# Patient Record
Sex: Female | Born: 1960 | Race: Black or African American | Hispanic: No | Marital: Married | Smoking: Never smoker
Health system: Southern US, Community
[De-identification: ages and names within clinical notes are randomized; demographics above are authoritative.]

## PROBLEM LIST (undated history)

## (undated) DIAGNOSIS — M549 Dorsalgia, unspecified: Secondary | ICD-10-CM

---

## 2017-03-15 ENCOUNTER — Emergency Department (HOSPITAL_COMMUNITY)

## 2017-03-15 ENCOUNTER — Encounter (HOSPITAL_COMMUNITY): Payer: Self-pay | Admitting: Emergency Medicine

## 2017-03-15 ENCOUNTER — Emergency Department (HOSPITAL_COMMUNITY)
Admission: EM | Admit: 2017-03-15 | Discharge: 2017-03-15 | Disposition: A | Discharge status: Home / Self Care | Attending: Emergency Medicine | Admitting: Emergency Medicine

## 2017-03-15 DIAGNOSIS — M5416 Radiculopathy, lumbar region: Secondary | ICD-10-CM | POA: Insufficient documentation

## 2017-03-15 DIAGNOSIS — E119 Type 2 diabetes mellitus without complications: Secondary | ICD-10-CM | POA: Insufficient documentation

## 2017-03-15 DIAGNOSIS — M549 Dorsalgia, unspecified: Secondary | ICD-10-CM

## 2017-03-15 DIAGNOSIS — I10 Essential (primary) hypertension: Secondary | ICD-10-CM | POA: Diagnosis not present

## 2017-03-15 DIAGNOSIS — M545 Low back pain: Secondary | ICD-10-CM | POA: Diagnosis present

## 2017-03-15 HISTORY — DX: Dorsalgia, unspecified: M54.9

## 2017-03-15 MED ORDER — LORAZEPAM 1 MG PO TABS
1.0000 mg | ORAL_TABLET | Freq: Once | ORAL | Status: AC
  Administered 2017-03-15: 1 mg via ORAL
  Filled 2017-03-15: qty 1

## 2017-03-15 MED ORDER — ACETAMINOPHEN 325 MG PO TABS
650.0000 mg | ORAL_TABLET | Freq: Four times a day (QID) | ORAL | 0 refills | Status: AC | PRN
Start: 2017-03-15 — End: ?

## 2017-03-15 MED ORDER — IBUPROFEN 400 MG PO TABS
600.0000 mg | ORAL_TABLET | Freq: Once | ORAL | Status: AC
  Administered 2017-03-15: 600 mg via ORAL
  Filled 2017-03-15: qty 1

## 2017-03-15 MED ORDER — METHOCARBAMOL 500 MG PO TABS
500.0000 mg | ORAL_TABLET | Freq: Once | ORAL | Status: AC
  Administered 2017-03-15: 500 mg via ORAL
  Filled 2017-03-15: qty 1

## 2017-03-15 MED ORDER — TRAMADOL HCL 50 MG PO TABS: 50.0000 mg | ORAL_TABLET | Freq: Four times a day (QID) | ORAL | 0 refills | Status: AC | PRN

## 2017-03-15 MED ORDER — METHOCARBAMOL 500 MG PO TABS: 500.0000 mg | ORAL_TABLET | Freq: Two times a day (BID) | ORAL | 0 refills | Status: AC

## 2017-03-15 NOTE — ED Triage Notes (Signed)
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

## 2017-03-15 NOTE — ED Notes (Signed)
Patient was called multiple times in the waiting room by multiple staff members without response.

## 2017-03-15 NOTE — ED Notes (Signed)
Declined W/C at D/C and was escorted to lobby by RN. 

## 2017-03-15 NOTE — ED Triage Notes (Signed)
Pt reports she is here for a MRI. Pt reports she was last in ED in [place] and while in the ED in [place] she was told she needed a MRI for the back pain that radiates into Lt leg. Pt denies any injury but has had previous back surgery. Pt reports her pain is a 12/10 Pt reports the pain scale highest is 10/10.

## 2017-03-15 NOTE — ED Notes (Signed)
Patient returned from MRI.

## 2017-03-15 NOTE — ED Triage Notes (Signed)
Pt. Stated, I've had a leg pain since June 15 and its getting worse. I had a rupture disc before. And its going to my leg.

## 2017-03-15 NOTE — ED Provider Notes (Signed)
[facility]-EMERGENCY DEPT Provider Note   CSN: [id number] Arrival date & time: 03/15/17  1239  By signing my name below, I, Jodi Riggs, attest that this documentation has been prepared under the direction and in the presence of Jodi IndustriesJeff Kena Limon, PA-C. Electronically Signed: Linna Darnerussell Riggs, Scribe. 03/15/2017. 3:19 PM.  History   Chief Complaint Chief Complaint  Patient presents with  . Leg Pain  . Back Pain   The history is provided by the patient. No language interpreter was used.    HPI Comments: Jodi Riggs is a 56 y.o. female with a PMHx of back pain, HTN, and DM and a PSHx of discectomy who presents to the Emergency Department complaining of progressively worsening lower back pain radiating down the left lower extremity since 02/21/17. She rates her pain at a 10/10 in severity. No recent trauma to her back. She reports some associated intermittent numbness in her left lower leg and foot as well as some occasional weakness of her left lower extremity. Patient has had difficulty ambulating secondary to her back pain. 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 She has also been taking Aleve at home without relief. Patient had an ultrasound of the LLE within the last few weeks which was negative for DVT. She ruptured a disc in her right lower back six years ago and states her current pain feels similar but on the opposite side. She denies fevers, chills, or any other associated symptoms. Patient [place] originally from [place], [place] but [place] She is staying with her daughter in [name] until the end of summer.  Past Medical History:  Diagnosis Date  . Back pain     There are no active problems to display for this patient.   History reviewed. No pertinent surgical history.  OB  History    No data available       Home Medications    Prior to Admission medications   Medication Sig Start Date End Date Taking? Authorizing Provider  acetaminophen (TYLENOL) 325 MG tablet Take 2 tablets (650 mg total) by mouth every 6 (six) hours as needed. 03/15/17   Jodi Riggs, Tinnie GensJeffrey, PA-C  methocarbamol (ROBAXIN) 500 MG tablet Take 1 tablet (500 mg total) by mouth 2 (two) times daily. 03/15/17   Jodi Riggs, Tinnie GensJeffrey, PA-C  traMADol (ULTRAM) 50 MG tablet Take 1 tablet (50 mg total) by mouth every 6 (six) hours as needed. 03/15/17   Jodi Riggs, Jodi Wolaver, PA-C    Family History No family history on file.  Social History Social History  Substance Use Topics  . Smoking status: Never Smoker  . Smokeless tobacco: Never Used  . Alcohol use Yes     Allergies   Levaquin [levofloxacin]   Review of Systems Review of Systems  Constitutional: Negative for chills and fever.  Musculoskeletal: Positive for back pain, gait problem and myalgias.  Neurological: Positive for weakness and numbness.  All other systems reviewed and are negative.  Physical Exam Updated Vital Signs BP (!) 141/61 (BP Location: Right Arm)   Pulse 78   Temp 98.4 F (36.9 C) (Oral)   Resp 18   Ht 5\' 6"  (1.676 m)   Wt 97.5 kg (215 lb)   SpO2 100%   BMI 34.70 kg/m   Physical Exam  Constitutional: She is oriented to person, place, and time. She appears well-developed and  well-nourished. No distress.  HENT:  Head: Normocephalic and atraumatic.  Eyes: Conjunctivae and EOM are normal.  Neck: Neck supple. No tracheal deviation present.  Cardiovascular: Normal rate.   Pulmonary/Chest: Effort normal. No respiratory distress.  Musculoskeletal: Normal range of motion.  Neurological: She is alert and oriented to person, place, and time.  Skin: Skin is warm and dry.  Psychiatric: She has a normal mood and affect. Her behavior is normal.  Nursing note and vitals reviewed.  ED Treatments / Results  Labs (all labs ordered  are listed, but only abnormal results are displayed) Labs Reviewed - No data to display  EKG  EKG Interpretation None       Radiology No results found.  Procedures Procedures (including critical care time)  DIAGNOSTIC STUDIES: Oxygen Saturation is 98% on RA, normal by my interpretation.    COORDINATION OF CARE: 3:19 PM Discussed treatment plan with pt at bedside and pt agreed to plan.  Medications Ordered in ED Medications  LORazepam (ATIVAN) tablet 1 mg (1 mg Oral Given 03/15/17 1539)  methocarbamol (ROBAXIN) tablet 500 mg (500 mg Oral Given 03/15/17 1539)  ibuprofen (ADVIL,MOTRIN) tablet 600 mg (600 mg Oral Given 03/15/17 1539)     Initial Impression / Assessment and Plan / ED Course  I have reviewed the triage vital signs and the nursing notes.  Pertinent labs & imaging results that were available during my care of the patient were reviewed by me and considered in my medical decision making (see chart for details).      Final Clinical Impressions(s) / ED Diagnoses   Final diagnoses:  Back pain  Lumbar radiculopathy   Patient presents with radiculopathy.  History of the same requesting MRI.  MRI shows disc protrusion likely causing her symptoms.  Patient is visiting from out of town, she will follow up with neurosurgery as directed, return precautions given.  Patient highly encouraged to follow-up with neurosurgery as soon as possible, she verbalized understanding and agreement to today's plan.   New Prescriptions Discharge Medication List as of 03/15/2017  5:46 PM    START taking these medications   Details  acetaminophen (TYLENOL) 325 MG tablet Take 2 tablets (650 mg total) by mouth every 6 (six) hours as needed., Starting Thu 03/15/2017, Print    methocarbamol (ROBAXIN) 500 MG tablet Take 1 tablet (500 mg total) by mouth 2 (two) times daily., Starting Thu 03/15/2017, Print    traMADol (ULTRAM) 50 MG tablet Take 1 tablet (50 mg total) by mouth every 6 (six) hours as  needed., Starting Thu 03/15/2017, Print       I personally performed the services described in this documentation, which was scribed in my presence. The recorded information has been reviewed and is accurate.    Jodi Mechanic, PA-C 03/28/17 1455    Jerelyn Scott, MD 03/30/17 [phone]

## 2017-03-15 NOTE — Discharge Instructions (Signed)
Please read attached information. If you experience any new or worsening signs or symptoms please return to the emergency room for evaluation. Please follow-up with your primary care provider or specialist as discussed. Please use medication prescribed only as directed and discontinue taking if you have any concerning signs or symptoms.   °

## 2017-03-23 ENCOUNTER — Telehealth: Payer: Self-pay | Admitting: Surgery

## 2017-03-23 NOTE — Telephone Encounter (Signed)
ED CM [facility] medications

## 2018-07-08 IMAGING — MR MR LUMBAR SPINE W/O CM
4 of 5 series · 18 of 48 positions shown · non-contrast
Comparison: None.

CLINICAL DATA: Progressively worsening low back pain radiating down
the left lower extremity since 02/21/2017. Left lower extremity
numbness and weakness intermittently. Prior discectomy.

EXAM:
MRI LUMBAR SPINE WITHOUT CONTRAST
TECHNIQUE: Multiplanar, multisequence MR imaging of the lumbar spine was
performed. No intravenous contrast was administered.

[Series 3: T2 · sagittal · 4.0mm · 0.55mm/px · 6 of 15 slices shown (1 of 2)]
[im 1/15]
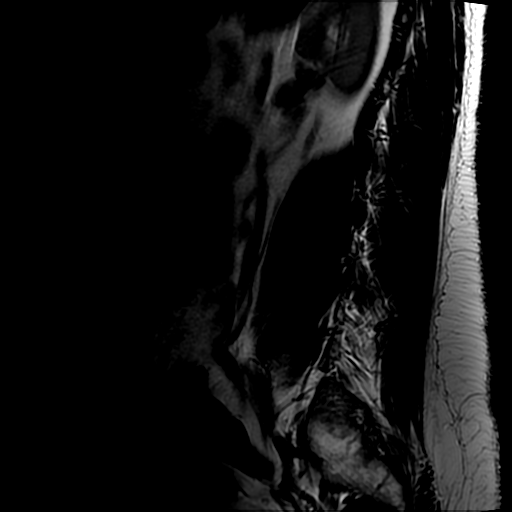
[im 3/15]
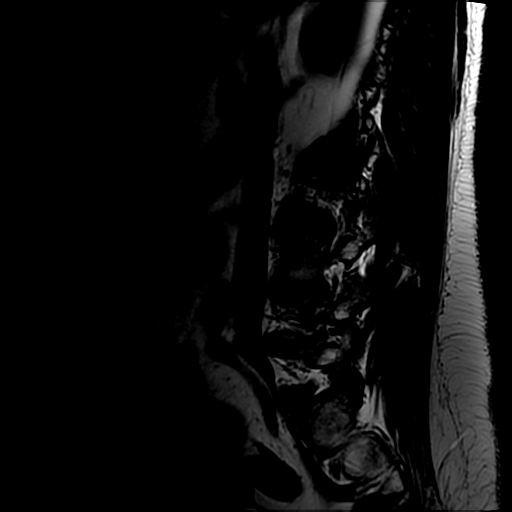
[im 6/15]
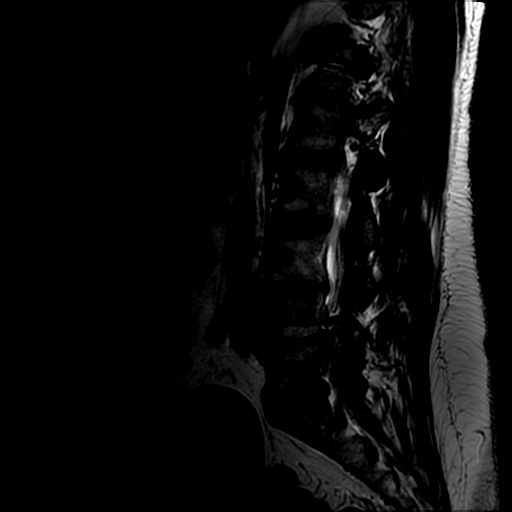
[im 9/15]
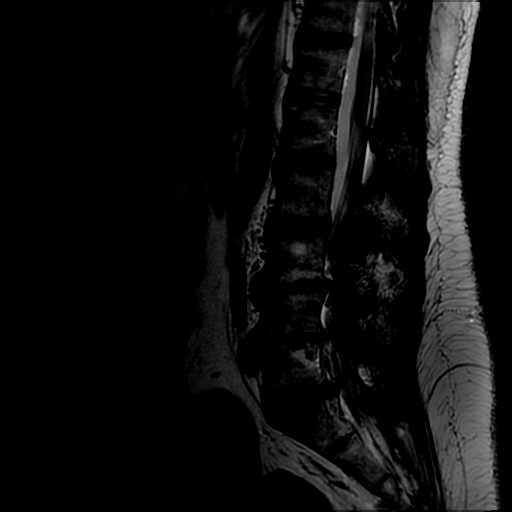
[im 12/15]
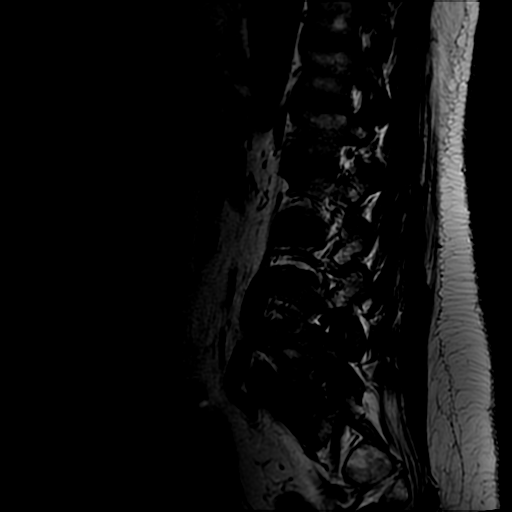
[im 15/15]
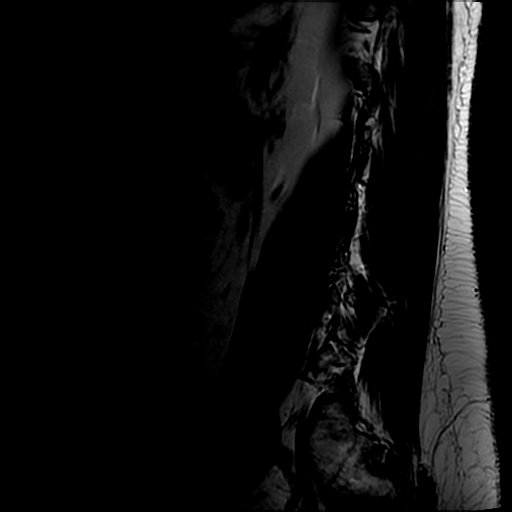

[Series 5: T1 · sagittal · 4.0mm · 0.55mm/px · 3 of 15 slices shown (1 of 2)]
[im 3/15]
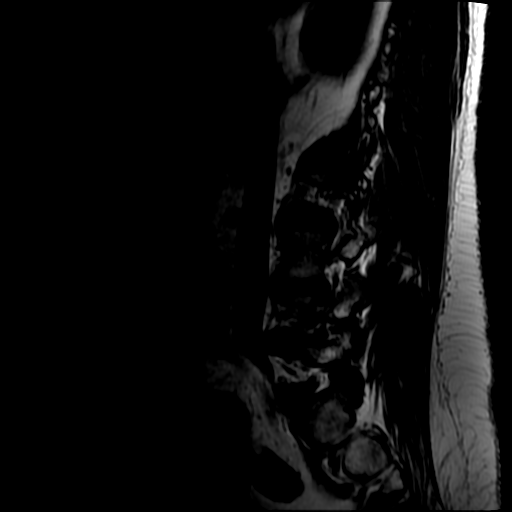
[im 9/15]
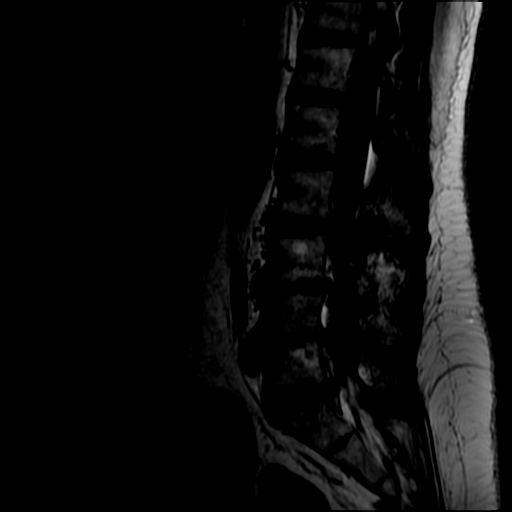
[im 15/15]
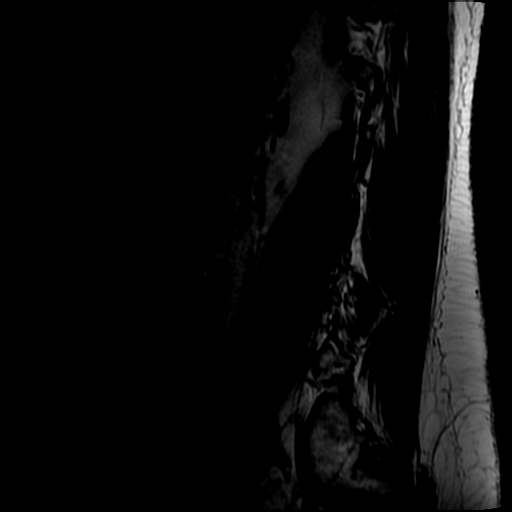

[Series 6: T2 · axial · 4.0mm · 0.39mm/px · z∈[-34,+131]mm · 6 of 40 slices shown (2 of 2)]
[im 1/40]
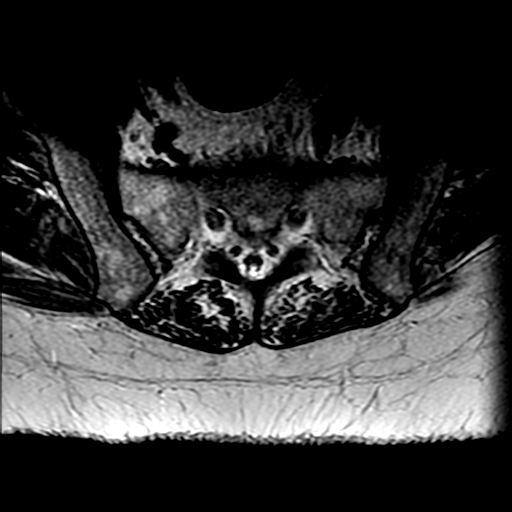
[im 6/40]
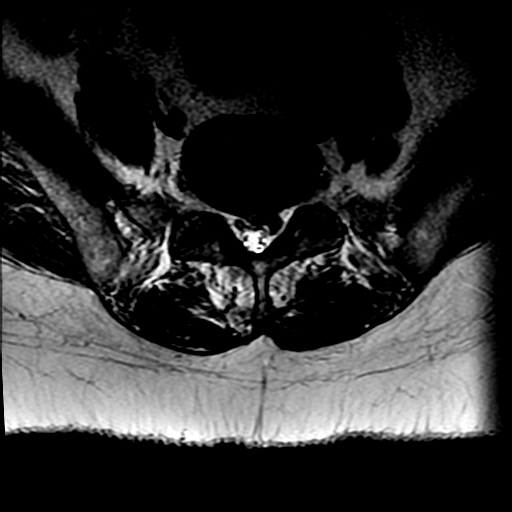
[im 12/40]
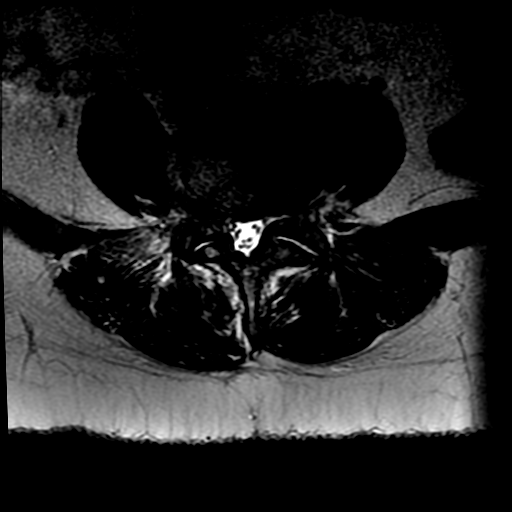
[im 17/40]
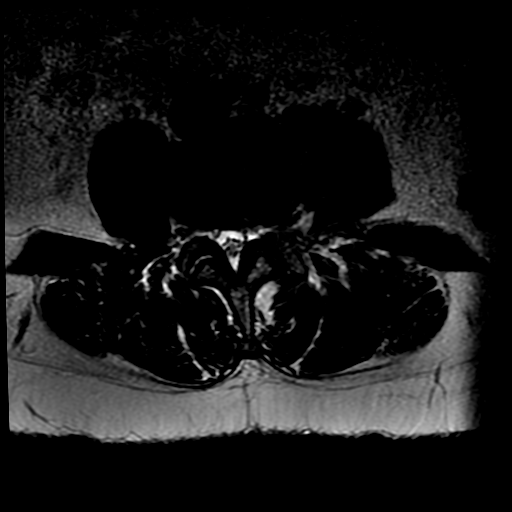
[im 20/40]
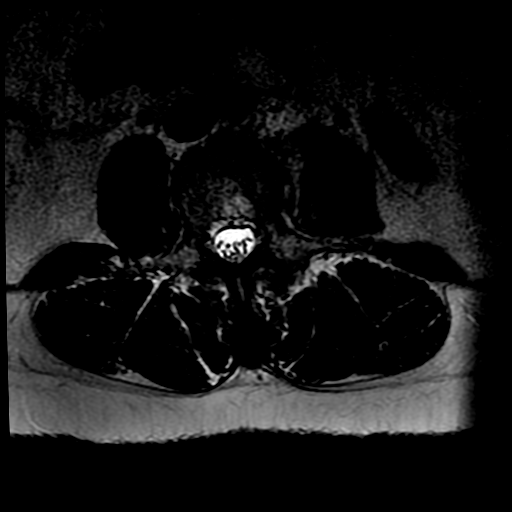
[im 34/40]
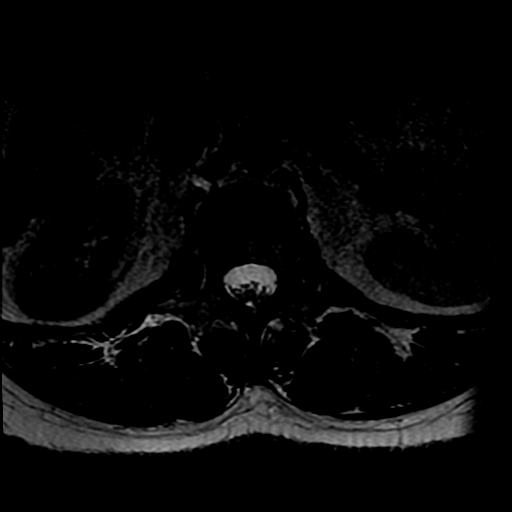

[Series 7: T1 · axial · 4.0mm · 0.39mm/px · z∈[-9,+131]mm · 3 of 40 slices shown (2 of 2)]
[im 6/40]
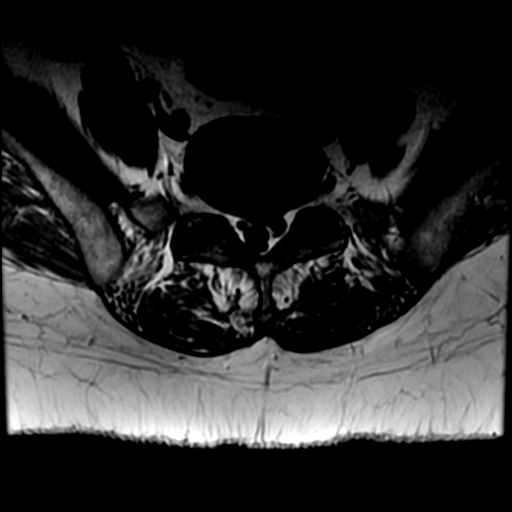
[im 20/40]
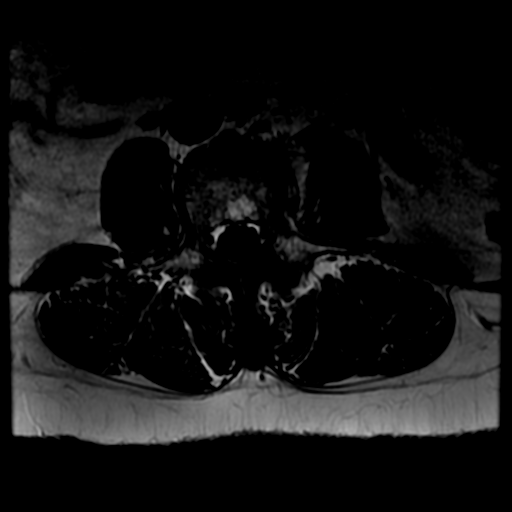
[im 34/40]
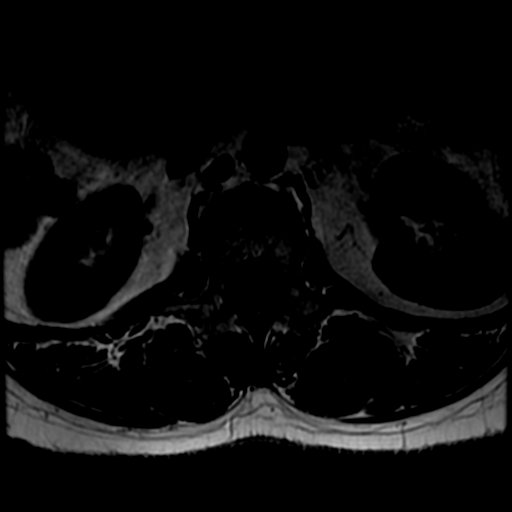

[18 of 48 positions shown; findings below may reference images not displayed]

FINDINGS: The study is mildly motion degraded.

Segmentation: Normal lumbar segmentation is assumed, with the lowest
fully formed disc space designated L5-S1.

Alignment: Straightening of the normal lumbar lordosis. Mild lumbar
dextroscoliosis. No listhesis.

Vertebrae: No fracture or suspicious osseous lesion. A few small
hemangiomas are noted. Mild type 1 and type 2 degenerative endplate
changes are present at L3-4 and L4-5 with moderate to severe disc
space narrowing at both levels.

Conus medullaris: Extends to the lower L1 level and appears normal.

Paraspinal and other soft tissues: Partially visualized enlarged,
heterogeneous uterus containing multiple masses, likely fibroids.

Disc levels:

T12-L1 and L1-2:  Negative.

L2-3: Shallow right foraminal to extraforaminal disc protrusion
without stenosis.

L3-4: Advanced disc degeneration. Small left subarticular disc
protrusion, asymmetric leftward disc bulging and endplate spurring,
and moderate facet and ligamentum flavum hypertrophy result in
moderate left lateral recess stenosis and moderate left neural
foraminal stenosis potentially affecting the left L3 and L4 nerve
roots.

L4-5: Advanced disc degeneration. Prior right laminotomy.
Circumferential disc bulging and moderate facet arthrosis result in
mild left lateral recess stenosis and severe right and mild left
neural foraminal stenosis without spinal stenosis.

L5-S1: Small to moderate-sized left paracentral disc protrusion
results in left lateral recess stenosis and displaces the traversing
left S1 nerve root. Disc bulging and mild facet arthrosis result in
mild right neural foraminal stenosis.
IMPRESSION: 1. L5-S1 disc protrusion likely affecting the left S1 nerve root in
the lateral recess.
2. Advanced L3-4 disc degeneration with moderate left lateral recess
and left neural foraminal stenosis potentially affecting the L3 and
L4 nerve roots.
3. Advanced disc degeneration and postoperative changes at L4-5 with
severe right neural foraminal stenosis.
